# Patient Record
Sex: Male | Born: 1980 | Hispanic: No | Marital: Single | Smoking: Never smoker
Health system: Southern US, Community
[De-identification: ages and names within clinical notes are randomized; demographics above are authoritative.]

---

## 2010-01-02 ENCOUNTER — Encounter
Admission: RE | Admit: 2010-01-02 | Discharge: 2010-01-02 | Payer: Self-pay | Source: Home / Self Care | Attending: Specialist | Admitting: Specialist

## 2010-03-25 ENCOUNTER — Emergency Department (HOSPITAL_COMMUNITY): Payer: Private Health Insurance - Indemnity

## 2010-03-25 ENCOUNTER — Emergency Department (HOSPITAL_COMMUNITY)
Admission: EM | Admit: 2010-03-25 | Discharge: 2010-03-26 | Disposition: A | Discharge status: Home / Self Care | Payer: Private Health Insurance - Indemnity | Attending: Emergency Medicine | Admitting: Emergency Medicine

## 2010-03-25 DIAGNOSIS — S0280XA Fracture of other specified skull and facial bones, unspecified side, initial encounter for closed fracture: Secondary | ICD-10-CM | POA: Insufficient documentation

## 2010-03-25 DIAGNOSIS — Y92009 Unspecified place in unspecified non-institutional (private) residence as the place of occurrence of the external cause: Secondary | ICD-10-CM | POA: Insufficient documentation

## 2010-03-25 DIAGNOSIS — H1130 Conjunctival hemorrhage, unspecified eye: Secondary | ICD-10-CM | POA: Insufficient documentation

## 2010-03-25 DIAGNOSIS — W1809XA Striking against other object with subsequent fall, initial encounter: Secondary | ICD-10-CM | POA: Insufficient documentation

## 2015-01-28 ENCOUNTER — Encounter: Payer: Self-pay | Admitting: Family Medicine

## 2015-01-28 ENCOUNTER — Ambulatory Visit (INDEPENDENT_AMBULATORY_CARE_PROVIDER_SITE_OTHER): Payer: Managed Care, Other (non HMO) | Admitting: Family Medicine

## 2015-01-28 VITALS — BP 122/80 | HR 68 | Wt 205.2 lb

## 2015-01-28 DIAGNOSIS — R196 Halitosis: Secondary | ICD-10-CM | POA: Diagnosis not present

## 2015-01-28 DIAGNOSIS — Z7189 Other specified counseling: Secondary | ICD-10-CM

## 2015-01-28 DIAGNOSIS — Z7689 Persons encountering health services in other specified circumstances: Secondary | ICD-10-CM

## 2015-01-28 NOTE — Patient Instructions (Addendum)
Schedule an appointment to have fasting blood work and physical. See a dentist soon to have your teeth and gums checked and cleaned.   Halitosis Halitosis is bad breath. Halitosis may be caused by:  Foods and drinks that you ingest.  Poor oral hygiene.  Medical conditions, such as sinus infections, mouth infections, and diabetes.  Medicines that dry out your mouth.  Smoking. HOME CARE INSTRUCTIONS  Practice good oral hygiene. Do this by:  Flossing every day. Ask your dentist to show you the best way to floss.  Brushing your teeth at least two times each day using toothpaste that is recommended by your dentist. Ask your dentist to show you the best way to brush your teeth.  Brushing your tongue when you brush your teeth. This may help to improve your breath.  Rinsing your mouth one time each day using mouthwash that is recommended by your dentist.  Scheduling and attending regular dental appointments.  Drink enough water to keep your urine clear or pale yellow.  Eat foods that help to keep your teeth clean, such as carrots and celery.  Avoid foods and drinks that can lead to bad breath, such as:  Garlic.  Onions.  Fish.  Coffee.  Alcohol.  Horseradish.  Red meat.  Do not use any tobacco products, including cigarettes, chewing tobacco, or electronic cigarettes. If you need help quitting, ask your health care provider.  Make sure that any mouth devices that you wear, such as a retainer or dentures, are worn and cleaned properly.  If you have a dry mouth, try chewing gum or mints that do not contain sugar. SEEK MEDICAL CARE IF:  You have new symptoms.  Your symptoms get worse or they do not improve with home care.   This information is not intended to replace advice given to you by your health care provider. Make sure you discuss any questions you have with your health care provider.   Document Released: 02/09/2004 Document Revised: 05/18/2014 Document  Reviewed: 12/28/2013 Elsevier Interactive Patient Education [employer].

## 2015-01-28 NOTE — Progress Notes (Signed)
   Subjective:    Patient ID: Joshua Boyd, male    DOB: 23-Jul-1980, 35 y.o.   MRN: [id number]  HPI Chief Complaint  Patient presents with  . new pt    new pt get est. bad breath 1-2 months, not going away after brushing teeth or flossing   He is new to the practice and here to establish primary care. He also has an acute complaint of bad breath for past 2 months. States he has not changed his diet, no new medications. Denies post nasal drainage, sinus issues, or tooth pain. Reports good oral hygiene, states he brushes his teeth 2 times daily with [employer], states he does brush his tongue. Flosses his teeth daily.  Last dentist appointment was 4 years ago.  Denies fever, chills, sore throat, nasal drainage, cough, nausea, vomiting, abdominal pain, GI or GU complaints. No recent illness.   Previous medical care has been in [place]. He works in [place].  He is from [place].   Denies fever, chills, nausea, headache.   Past medical history: denies  Medications: none Surgeries: none Last physical exam and labs June 2016 in [place]. No medical records. Will need him to have them sent to me.  He states his insurance company told him he needs a physical by April.   Social history: Denies smoking, alcohol use 3 times per week, denies drug use. Lives with wife and 81 year old son.     Review of Systems Pertinent positives and negatives in the history of present illness.    Objective:   Physical Exam  Constitutional: He appears well-developed and well-nourished. No distress.  HENT:  Mouth/Throat: Uvula is midline, oropharynx is clear and moist and mucous membranes are normal. No oral lesions. No dental abscesses. No oropharyngeal exudate or posterior oropharyngeal edema.  Significant tartar buildup, no obvious gum inflammation  Lymphadenopathy:       Head (right side): No submental, no submandibular and no tonsillar adenopathy present.       Head (left side): No  submental, no submandibular and no tonsillar adenopathy present.    He has no cervical adenopathy.       Right: No supraclavicular adenopathy present.       Left: No supraclavicular adenopathy present.   BP 122/80 mmHg  Pulse 68  Wt 205 lb 3.2 oz (93.078 kg)        Assessment & Plan:  Halitosis  Encounter to establish care  Discussed differentials for halitosis including poor oral hygiene and underlying health problems including sinus issues, tonsil stones, liver disease or diabetes. He does not appear to be infectious. No sign of dental abscess, gums do not appear inflamed. He does have significant tartar and recommend seeing a dentist for cleaning and further evaluation. He reports normal labs approximately 7 months ago. He will get medical records sent to me. He will schedule for physical and fasting blood work.

## 2015-03-03 ENCOUNTER — Ambulatory Visit (INDEPENDENT_AMBULATORY_CARE_PROVIDER_SITE_OTHER): Payer: Managed Care, Other (non HMO) | Admitting: Family Medicine

## 2015-03-03 ENCOUNTER — Encounter: Payer: Self-pay | Admitting: Family Medicine

## 2015-03-03 VITALS — BP 122/80 | HR 64 | Ht 69.0 in | Wt 207.2 lb

## 2015-03-03 DIAGNOSIS — Z0000 Encounter for general adult medical examination without abnormal findings: Secondary | ICD-10-CM

## 2015-03-03 DIAGNOSIS — E669 Obesity, unspecified: Secondary | ICD-10-CM

## 2015-03-03 DIAGNOSIS — R196 Halitosis: Secondary | ICD-10-CM

## 2015-03-03 LAB — CBC WITH DIFFERENTIAL/PLATELET
Basophils Absolute: 0 10*3/uL (ref 0.0–0.1)
Basophils Relative: 0 % (ref 0–1)
Eosinophils Absolute: 0.3 10*3/uL (ref 0.0–0.7)
Eosinophils Relative: 6 % — ABNORMAL HIGH (ref 0–5)
HEMATOCRIT: 40.8 % (ref 39.0–52.0)
HEMOGLOBIN: 13.1 g/dL (ref 13.0–17.0)
LYMPHS PCT: 45 % (ref 12–46)
Lymphs Abs: 2.1 10*3/uL (ref 0.7–4.0)
MCH: 25.6 pg — AB (ref 26.0–34.0)
MCHC: 32.1 g/dL (ref 30.0–36.0)
MCV: 79.7 fL (ref 78.0–100.0)
MONO ABS: 0.4 10*3/uL (ref 0.1–1.0)
MONOS PCT: 8 % (ref 3–12)
MPV: 9.9 fL (ref 8.6–12.4)
NEUTROS ABS: 1.9 10*3/uL (ref 1.7–7.7)
Neutrophils Relative %: 41 % — ABNORMAL LOW (ref 43–77)
Platelets: 231 10*3/uL (ref 150–400)
RBC: 5.12 MIL/uL (ref 4.22–5.81)
RDW: 15.6 % — ABNORMAL HIGH (ref 11.5–15.5)
WBC: 4.7 10*3/uL (ref 4.0–10.5)

## 2015-03-03 LAB — POCT URINALYSIS DIPSTICK
Bilirubin, UA: NEGATIVE
Blood, UA: NEGATIVE
GLUCOSE UA: NEGATIVE
KETONES UA: NEGATIVE
Leukocytes, UA: NEGATIVE
Nitrite, UA: NEGATIVE
PROTEIN UA: NEGATIVE
SPEC GRAV UA: 1.02
Urobilinogen, UA: NEGATIVE
pH, UA: 7.5

## 2015-03-03 NOTE — Patient Instructions (Signed)
Preventative Care for Adults, Male       REGULAR HEALTH EXAMS:  A routine yearly physical is a good way to check in with your primary care provider about your health and preventive screening. It is also an opportunity to share updates about your health and any concerns you have, and receive a thorough all-over exam.   Most health insurance companies pay for at least some preventative services.  Check with your health plan for specific coverages.  WHAT PREVENTATIVE SERVICES DO MEN NEED?  Adult men should have their weight and blood pressure checked regularly.   Men age 35 and older should have their cholesterol levels checked regularly.  Beginning at age 50 and continuing to age 75, men should be screened for colorectal cancer.  Certain people should may need continued testing until age 85.  Other cancer screening may include exams for testicular and prostate cancer.  Updating vaccinations is part of preventative care.  Vaccinations help protect against diseases such as the flu.  Lab tests are generally done as part of preventative care to screen for anemia and blood disorders, to screen for problems with the kidneys and liver, to screen for bladder problems, to check blood sugar, and to check your cholesterol level.  Preventative services generally include counseling about diet, exercise, avoiding tobacco, drugs, excessive alcohol consumption, and sexually transmitted infections.    GENERAL RECOMMENDATIONS FOR GOOD HEALTH:  Healthy diet:  Eat a variety of foods, including fruit, vegetables, animal or vegetable protein, such as meat, fish, chicken, and eggs, or beans, lentils, tofu, and grains, such as rice.  Drink plenty of water daily.  Decrease saturated fat in the diet, avoid lots of red meat, processed foods, sweets, fast foods, and fried foods.  Exercise:  Aerobic exercise helps maintain good heart health. At least 30-40 minutes of moderate-intensity exercise is recommended.  For example, a brisk walk that increases your heart rate and breathing. This should be done on most days of the week.   Find a type of exercise or a variety of exercises that you enjoy so that it becomes a part of your daily life.  Examples are running, walking, swimming, water aerobics, and biking.  For motivation and support, explore group exercise such as aerobic class, spin class, Zumba, Yoga,or  martial arts, etc.    Set exercise goals for yourself, such as a certain weight goal, walk or run in a race such as a 5k walk/run.  Speak to your primary care provider about exercise goals.  Disease prevention:  If you smoke or chew tobacco, find out from your caregiver how to quit. It can literally save your life, no matter how long you have been a tobacco user. If you do not use tobacco, never begin.   Maintain a healthy diet and normal weight. Increased weight leads to problems with blood pressure and diabetes.   The Body Mass Index or BMI is a way of measuring how much of your body is fat. Having a BMI above 27 increases the risk of heart disease, diabetes, hypertension, stroke and other problems related to obesity. Your caregiver can help determine your BMI and based on it develop an exercise and dietary program to help you achieve or maintain this important measurement at a healthful level.  High blood pressure causes heart and blood vessel problems.  Persistent high blood pressure should be treated with medicine if weight loss and exercise do not work.   Fat and cholesterol leaves deposits in your arteries   that can block them. This causes heart disease and vessel disease elsewhere in your body.  If your cholesterol is found to be high, or if you have heart disease or certain other medical conditions, then you may need to have your cholesterol monitored frequently and be treated with medication.   Ask if you should have a stress test if your history suggests this. A stress test is a test done on  a treadmill that looks for heart disease. This test can find disease prior to there being a problem.  Avoid drinking alcohol in excess (more than two drinks per day).  Avoid use of street drugs. Do not share needles with anyone. Ask for professional help if you need assistance or instructions on stopping the use of alcohol, cigarettes, and/or drugs.  Brush your teeth twice a day with fluoride toothpaste, and floss once a day. Good oral hygiene prevents tooth decay and gum disease. The problems can be painful, unattractive, and can cause other health problems. Visit your dentist for a routine oral and dental check up and preventive care every 6-12 months.   Look at your skin regularly.  Use a mirror to look at your back. Notify your caregivers of changes in moles, especially if there are changes in shapes, colors, a size larger than a pencil eraser, an irregular border, or development of new moles.  Safety:  Use seatbelts 100% of the time, whether driving or as a passenger.  Use safety devices such as hearing protection if you work in environments with loud noise or significant background noise.  Use safety glasses when doing any work that could send debris in to the eyes.  Use a helmet if you ride a bike or motorcycle.  Use appropriate safety gear for contact sports.  Talk to your caregiver about gun safety.  Use sunscreen with a SPF (or skin protection factor) of 15 or greater.  Lighter skinned people are at a greater risk of skin cancer. Don't forget to also wear sunglasses in order to protect your eyes from too much damaging sunlight. Damaging sunlight can accelerate cataract formation.   Practice safe sex. Use condoms. Condoms are used for birth control and to help reduce the spread of sexually transmitted infections (or STIs).  Some of the STIs are gonorrhea (the clap), chlamydia, syphilis, trichomonas, herpes, HPV (human papilloma virus) and HIV (human immunodeficiency virus) which causes AIDS.  The herpes, HIV and HPV are viral illnesses that have no cure. These can result in disability, cancer and death.   Keep carbon monoxide and smoke detectors in your home functioning at all times. Change the batteries every 6 months or use a model that plugs into the wall.   Vaccinations:  Stay up to date with your tetanus shots and other required immunizations. You should have a booster for tetanus every 10 years. Be sure to get your flu shot every year, since 5%-20% of the [place] population comes down with the flu. The flu vaccine changes each year, so being vaccinated once is not enough. Get your shot in the fall, before the flu season peaks.   Other vaccines to consider:  Pneumococcal vaccine to protect against certain types of pneumonia.  This is normally recommended for adults age 65 or older.  However, adults younger than 33 years old with certain underlying conditions such as diabetes, heart or lung disease should also receive the vaccine.  Shingles vaccine to protect against Varicella Zoster if you are older than age 60, or younger   than 33 years old with certain underlying illness.  Hepatitis A vaccine to protect against a form of infection of the liver by a virus acquired from food.  Hepatitis B vaccine to protect against a form of infection of the liver by a virus acquired from blood or body fluids, particularly if you work in health care.  If you plan to travel internationally, check with your local health department for specific vaccination recommendations.  Cancer Screening:  Most routine colon cancer screening begins at the age of 50. On a yearly basis, doctors may provide special easy to use take-home tests to check for hidden blood in the stool. Sigmoidoscopy or colonoscopy can detect the earliest forms of colon cancer and is life saving. These tests use a small camera at the end of a tube to directly examine the colon. Speak to your caregiver about this at age 50, when routine  screening begins (and is repeated every 5 years unless early forms of pre-cancerous polyps or small growths are found).   At the age of 50 men usually start screening for prostate cancer every year. Screening may begin at a younger age for those with higher risk. Those at higher risk include African-Americans or having a family history of prostate cancer. There are two types of tests for prostate cancer:   Prostate-specific antigen (PSA) testing. Recent studies raise questions about prostate cancer using PSA and you should discuss this with your caregiver.   Digital rectal exam (in which your doctor's lubricated and gloved finger feels for enlargement of the prostate through the anus).   Screening for testicular cancer.  Do a monthly exam of your testicles. Gently roll each testicle between your thumb and fingers, feeling for any abnormal lumps. The best time to do this is after a hot shower or bath when the tissues are looser. Notify your caregivers of any lumps, tenderness or changes in size or shape immediately.     

## 2015-03-03 NOTE — Progress Notes (Signed)
Subjective:    Patient ID: Joshua Boyd, male    DOB: 01-Dec-1980, 35 y.o.   MRN: [id number]  HPI Chief Complaint  Patient presents with  . fasting cpe    fasting cpe. no other concerns   He is here for complete physical exam. He states his last exam and blood work was June 2016 but his insurance company told him he needed to have a physical by April 2017. Moved here from [place] 5 years ago.  I do not have medical records from his previous PCP, he will sign release today.  States he has not seen dentist yet. This was recommended at his last appointment for halitosis.  States his bad breath has improved since his last visit and that he has been brushing and flossing more.   Other providers: none  Diet: yams, maize, soups with fish, beef. Pasta. Fruits and vegetables. "african diet" Exercise: not really.  Immunizations: no flu shot- refuses, Tdap- 5 years ( he reports having this when he moved from [place])  Health maintenance-  Dentist: years ago. Overdue.  Last eye exam: never  Colonoscopy: never  Family history significant for HTN  Safety:  Wears seatbelt: always Smoke detectors: always No guns in home.   Lives with wife and son who is 2 Works: Surveyor, quantity for [employer]    Review of Systems Review of Systems Constitutional: -fever, -chills, -sweats, -unexpected weight change,-fatigue ENT: -runny nose, -ear pain, -sore throat Cardiology:  -chest pain, -palpitations, -edema Respiratory: -cough, -shortness of breath, -wheezing Gastroenterology: -abdominal pain, -nausea, -vomiting, -diarrhea, -constipation  Hematology: -bleeding or bruising problems Musculoskeletal: -arthralgias, -myalgias, -joint swelling, -back pain Ophthalmology: -vision changes Urology: -dysuria, -difficulty urinating, -hematuria, -urinary frequency, -urgency Neurology: -headache, -weakness, -tingling, -numbness       Objective:   Physical Exam BP 122/80 mmHg  Pulse 64  Ht 5\' 9"   (1.753 m)  Wt 207 lb 3.2 oz (93.985 kg)  BMI 30.58 kg/m2  General Appearance:    Alert, cooperative, no distress, appears stated age  Head:    Normocephalic, without obvious abnormality, atraumatic  Eyes:    PERRL, conjunctiva/corneas clear, EOM's intact, fundi    benign  Ears:    Normal TM's and external ear canals  Nose:   Nares normal, mucosa normal, no drainage or sinus   tenderness  Throat:   Lips, mucosa, and tongue normal; teeth and gums normal  Neck:   Supple, no lymphadenopathy;  thyroid:  no   enlargement/tenderness/nodules; no carotid   bruit or JVD  Back:    Spine nontender, no curvature, ROM normal, no CVA     tenderness  Lungs:     Clear to auscultation bilaterally without wheezes, rales or     ronchi; respirations unlabored  Chest Wall:    No tenderness or deformity   Heart:    Regular rate and rhythm, S1 and S2 normal, no murmur, rub   or gallop  Breast Exam:    No chest wall tenderness, masses or gynecomastia  Abdomen:     Soft, non-tender, nondistended, normoactive bowel sounds,    no masses, no hepatosplenomegaly  Genitalia:    Refused  Rectal:   Deferred due to age <40 and lack of symptoms  Extremities:   No clubbing, cyanosis or edema  Pulses:   2+ and symmetric all extremities  Skin:   Skin color, texture, turgor normal, no rashes or lesions  Lymph nodes:   Cervical, supraclavicular, and axillary nodes normal  Neurologic:   CNII-XII  intact, normal strength, sensation and gait; reflexes 2+ and symmetric throughout          Psych:   Normal mood, affect, hygiene and grooming.     Urinalysis dipstick: negative     Assessment & Plan:  Routine general medical examination at a health care facility - Plan: POCT urinalysis dipstick, CBC with Differential/Platelet, Comprehensive metabolic panel, Lipid panel  Obesity  Halitosis  Discussed that his BMI places him in the obese category and I recommend that he be aware of his calorie intake and start getting a minimum  of 150 minutes of physical activity per week.  Discussed safety and health maintenance.  Recommend that he schedule a dental appointment for cleaning and overall oral health.  Refused flu shot.

## 2015-03-04 LAB — LIPID PANEL
CHOLESTEROL: 198 mg/dL (ref 125–200)
HDL: 84 mg/dL (ref >=40)
LDL CALC: 105 mg/dL (ref <130)
TRIGLYCERIDES: 43 mg/dL (ref <150)
Total CHOL/HDL Ratio: 2.4 Ratio (ref <=5.0)
VLDL: 9 mg/dL (ref <30)

## 2015-03-04 LAB — COMPREHENSIVE METABOLIC PANEL
ALBUMIN: 4.4 g/dL (ref 3.6–5.1)
ALK PHOS: 92 U/L (ref 40–115)
ALT: 30 U/L (ref 9–46)
AST: 27 U/L (ref 10–40)
BILIRUBIN TOTAL: 0.4 mg/dL (ref 0.2–1.2)
BUN: 13 mg/dL (ref 7–25)
CO2: 28 mmol/L (ref 20–31)
CREATININE: 1.1 mg/dL (ref 0.60–1.35)
Calcium: 10.4 mg/dL — ABNORMAL HIGH (ref 8.6–10.3)
Chloride: 104 mmol/L (ref 98–110)
Glucose, Bld: 92 mg/dL (ref 65–99)
Potassium: 4.6 mmol/L (ref 3.5–5.3)
SODIUM: 140 mmol/L (ref 135–146)
TOTAL PROTEIN: 7.5 g/dL (ref 6.1–8.1)

## 2018-12-08 ENCOUNTER — Other Ambulatory Visit: Payer: Self-pay | Admitting: Internal Medicine

## 2018-12-08 DIAGNOSIS — E215 Disorder of parathyroid gland, unspecified: Secondary | ICD-10-CM

## 2018-12-19 ENCOUNTER — Ambulatory Visit
Admission: RE | Admit: 2018-12-19 | Discharge: 2018-12-19 | Disposition: A | Discharge status: Home / Self Care | Payer: 59 | Source: Ambulatory Visit | Attending: Internal Medicine | Admitting: Internal Medicine

## 2018-12-19 DIAGNOSIS — E215 Disorder of parathyroid gland, unspecified: Secondary | ICD-10-CM

## 2019-01-02 ENCOUNTER — Other Ambulatory Visit: Payer: Self-pay | Admitting: Internal Medicine

## 2019-01-02 ENCOUNTER — Other Ambulatory Visit: Payer: Self-pay

## 2019-01-02 DIAGNOSIS — D351 Benign neoplasm of parathyroid gland: Secondary | ICD-10-CM

## 2019-01-28 ENCOUNTER — Other Ambulatory Visit: Payer: 59

## 2019-03-18 ENCOUNTER — Ambulatory Visit
Admission: RE | Admit: 2019-03-18 | Discharge: 2019-03-18 | Disposition: A | Discharge status: Home / Self Care | Payer: 59 | Source: Ambulatory Visit | Attending: Internal Medicine | Admitting: Internal Medicine

## 2019-03-18 DIAGNOSIS — D351 Benign neoplasm of parathyroid gland: Secondary | ICD-10-CM

## 2019-03-18 MED ORDER — IOPAMIDOL (ISOVUE-300) INJECTION 61%
75.0000 mL | Freq: Once | INTRAVENOUS | Status: AC | PRN
  Administered 2019-03-18: 75 mL via INTRAVENOUS

## 2021-12-19 IMAGING — CT CT NECK SOFT TISSUE WO/W CM
3 of 9 series · 10 of 33 positions shown, 11 images · IV contrast (iopamidol)
Comparison: Thyroid ultrasound 12/19/2018

CLINICAL DATA: Parathyroid adenoma.

EXAM:
CT NECK WITH AND WITHOUT CONTRAST
TECHNIQUE: Multidetector CT imaging of the neck was performed without and with
intravenous contrast.
CONTRAST:  75mL 1SRMVU-0AA IOPAMIDOL (1SRMVU-0AA) INJECTION 61%

[Series 6: (id) mm · axial · 0.42mm/px · z∈[-201,-141]mm · 2 of 182 slices shown, 3 images]
[im 61/182  soft-tissue]
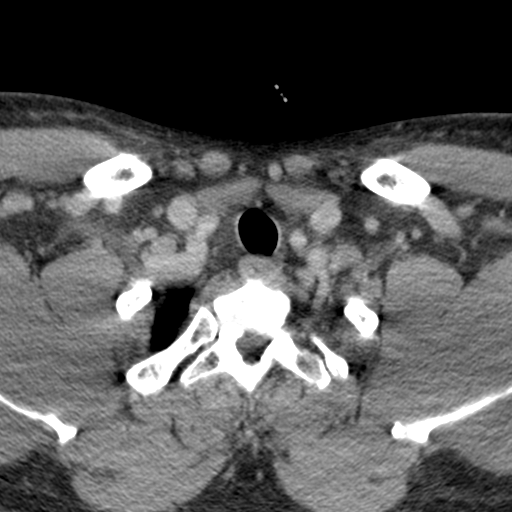
[im 61/182  bone]
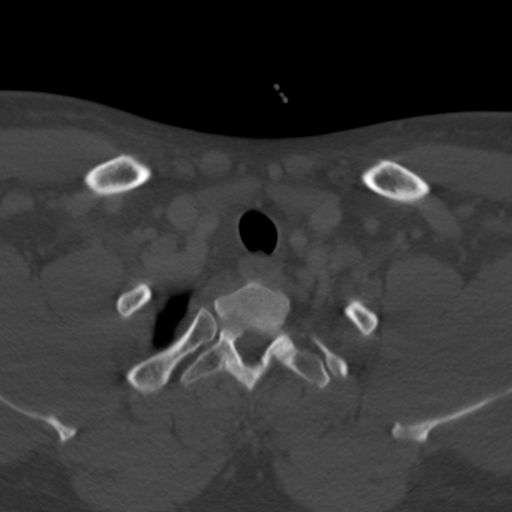
[im 121/182  bone]
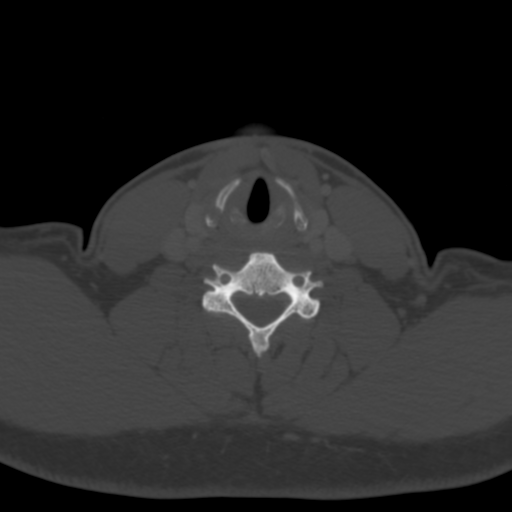

[Series 9: (id) · sagittal · 0.36mm/px · 5 of 113 slices shown]
[im 19/113  bone]
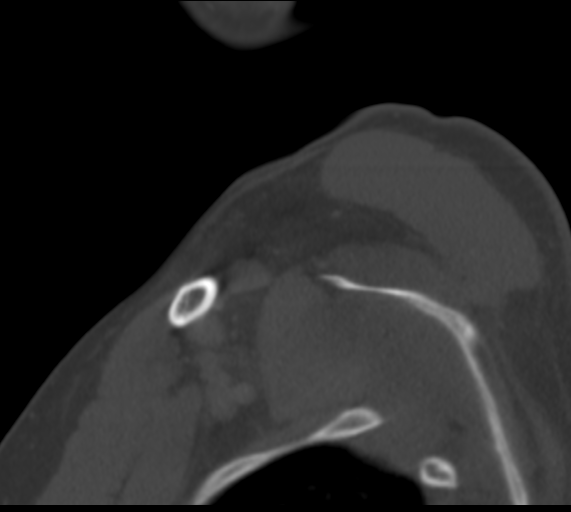
[im 38/113  bone]
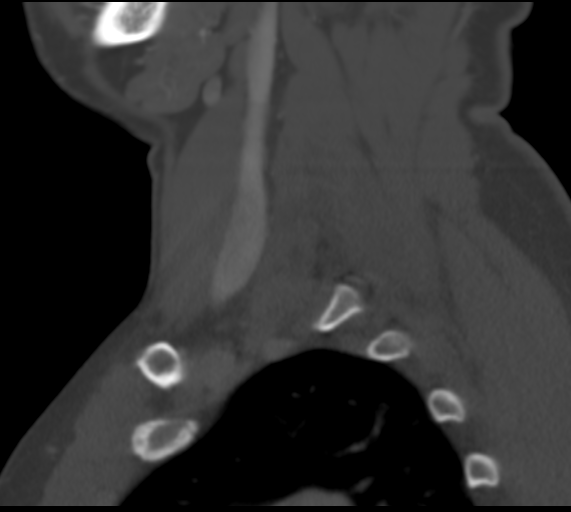
[im 57/113  bone]
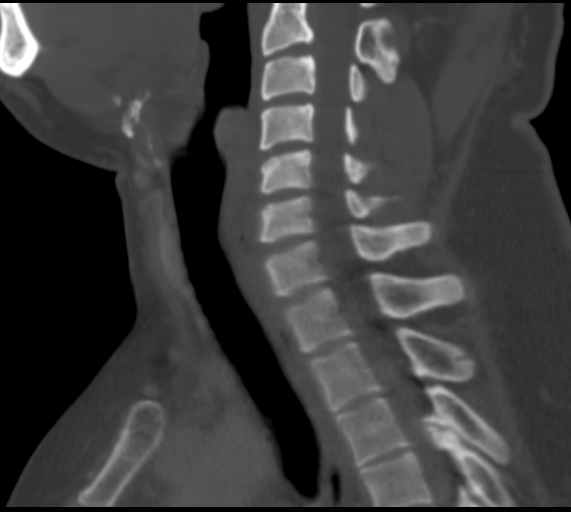
[im 75/113  bone]
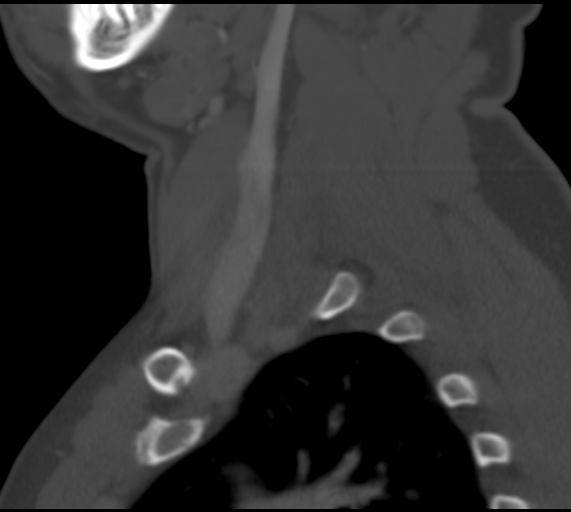
[im 94/113  bone]
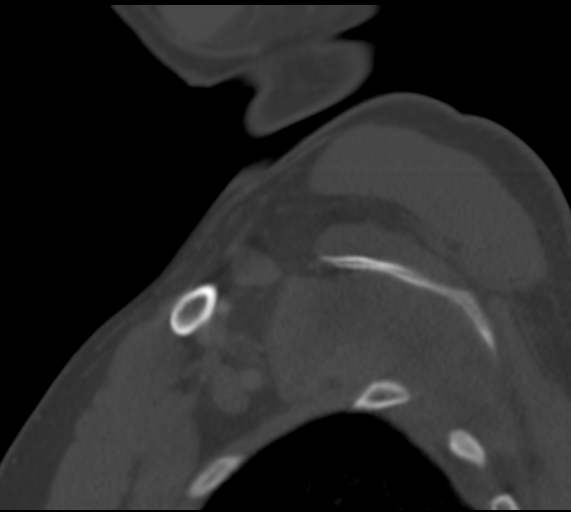

[Series 11: neck-venous-cor 2mm · coronal · portal-venous · 0.35mm/px · 3 of 111 slices shown]
[im 30/111  bone]
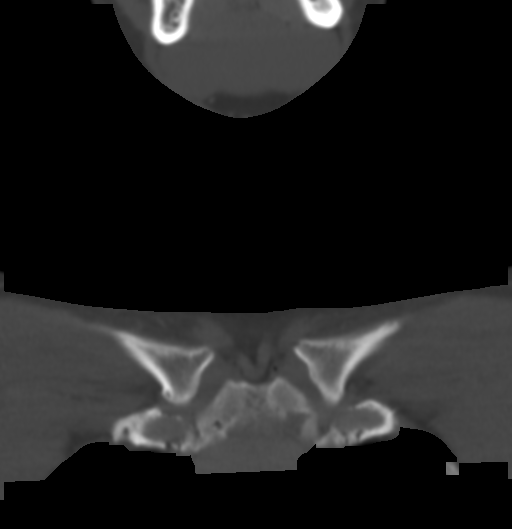
[im 50/111  bone]
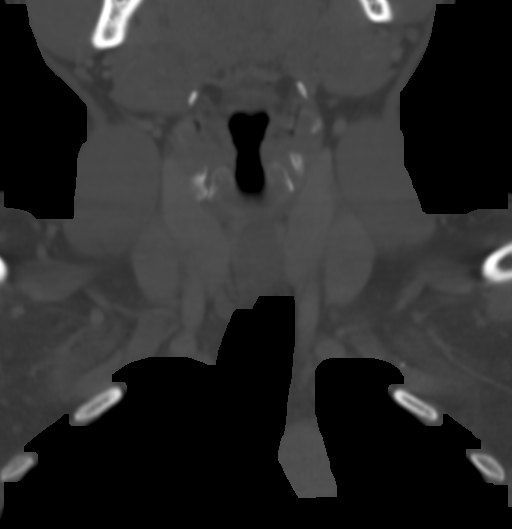
[im 70/111  bone]
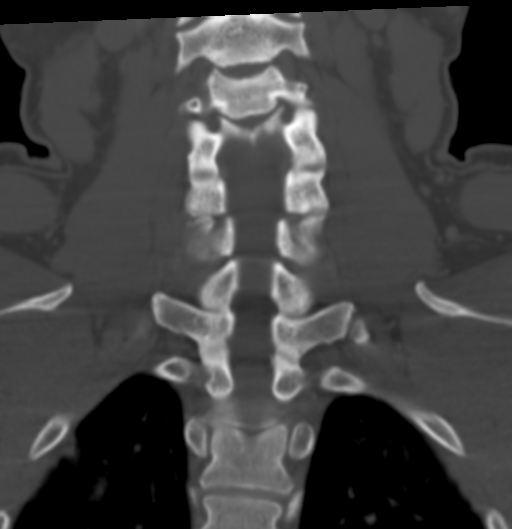

[10 of 33 positions shown; findings below may reference images not displayed]

FINDINGS: Pharynx and larynx: No evidence of mass or swelling.

Salivary glands: Unremarkable appearance of the submandibular glands
and included portions of the parotid glands.

Thyroid: The thyroid is unremarkable. A 1.3 cm soft tissue nodule
posterior to the lower pole of the right thyroid lobe likely
corresponds to the nodule seen on ultrasound. This is
hypoattenuating relative to the thyroid gland on noncontrast,
arterial, and venous phases and demonstrates only mild enhancement.
No mass is identified elsewhere in the neck or superior mediastinum.

Lymph nodes: No enlarged or suspicious lymph nodes in the neck.

Vascular: Unremarkable.

Skeleton: No suspicious osseous lesion.

Upper chest: Clear lung apices.

Other: None.
IMPRESSION: 1. 1.3 cm nodule posterior to the lower pole of the right thyroid
lobe. While a parathyroid adenoma is a consideration, this does not
demonstrate the classic enhancement pattern, and alternative
etiologies such as a lymph node are also possible.
2. No other candidate lesions identified elsewhere in the neck or
superior mediastinum.

## 2022-05-23 ENCOUNTER — Encounter: Payer: Self-pay | Admitting: Dietician

## 2022-05-23 ENCOUNTER — Encounter: Payer: PRIVATE HEALTH INSURANCE | Attending: Internal Medicine | Admitting: Dietician

## 2022-05-23 VITALS — Ht 69.0 in | Wt 233.0 lb

## 2022-05-23 DIAGNOSIS — E119 Type 2 diabetes mellitus without complications: Secondary | ICD-10-CM

## 2022-05-23 NOTE — Patient Instructions (Addendum)
Goal: continue walking 2 miles a day and soccer on weekend.   Goal: aim to make 1/2 of your plate vegetables at least 1 time per day.   Goal: Drink at least 4 water bottles daily (64 oz).   Aim to eat within 1-2 hours of waking up and every 3-5 hours following. At snacks aim to include a complex carbohydrate and protein.  Aim to focus on primarily water intake. Reduce juice.   At meals aim to include 1/4 protein, 1/4 complex carbs, 1/2 plate non-starchy vegetables.

## 2022-05-23 NOTE — Progress Notes (Signed)
Diabetes Self-Management Education  Visit Type: First/Initial  Appt. Start Time: 1545 Appt. End Time: 1635  05/23/2022  [name], identified by name and date of birth, is a 42 y.o. male with a diagnosis of Diabetes: Type 2.   ASSESSMENT  History includes: type 2 diabetes Labs noted: A1c Medications include: metformin Supplements: none reported  Did not have pt's labs at time of visit. Pt is unsure of his exact A1c but states he thinks it is in the 8's.   Pt reports he has scheduled his yearly eye exam but not his dental exam yet.   Pt states he usually skips breakfast because he feels that it will make him sluggish at work.   Pt states the last 3-4 months have been his busy season at work and he feels that he was drinking mostly coffee, not much water, eating out more, and not exercising. Pt states he is now in a [employer] group that walks 2 miles every day to total 10 miles per week which is what he has been doing.   Pt usually packs leftovers for work. Pt's wife cooks dinner at home most nights and pt reports they do not eat out often.   Pt states his meals mostly consist of rice and beans and yam/corn. He states he does not include a lot of vegetables unless they are in a stew. He wants to work on including more vegetables in his meal.  Height 5\' 9"  (1.753 m), weight 233 lb (105.7 kg). Body mass index is 34.41 kg/m.   Diabetes Self-Management Education - 05/23/22 1554       Visit Information   Visit Type First/Initial      Initial Visit   Diabetes Type Type 2    Date Diagnosed april 2024    Are you currently following a meal plan? No    Are you taking your medications as prescribed? Yes      Health Coping   How would you rate your overall health? Fair      Psychosocial Assessment   Patient Belief/Attitude about Diabetes Afraid    What is the hardest part about your diabetes right now, causing you the most concern, or is the most worrisome to you about  your diabetes?   Making healty food and beverage choices    Self-care barriers None    Self-management support Doctor's office    Other persons present Patient    Patient Concerns Nutrition/Meal planning    Special Needs None    Preferred Learning Style No preference indicated    Learning Readiness Ready    How often do you need to have someone help you when you read instructions, pamphlets, or other written materials from your doctor or pharmacy? 1 - Never    What is the last grade level you completed in school? MBA      Pre-Education Assessment   Patient understands the diabetes disease and treatment process. Needs Instruction    Patient understands incorporating nutritional management into lifestyle. Needs Instruction    Patient undertands incorporating physical activity into lifestyle. Needs Instruction    Patient understands using medications safely. Needs Instruction    Patient understands monitoring blood glucose, interpreting and using results Needs Instruction    Patient understands prevention, detection, and treatment of acute complications. Needs Instruction    Patient understands prevention, detection, and treatment of chronic complications. Needs Instruction    Patient understands how to develop strategies to address psychosocial issues. Needs Instruction  Patient understands how to develop strategies to promote health/change behavior. Needs Instruction      Complications   How often do you check your blood sugar? 0 times/day (not testing)    Have you had a dilated eye exam in the past 12 months? No    Have you had a dental exam in the past 12 months? No    Are you checking your feet? No      Dietary Intake   Breakfast none    Snack (morning) none    Lunch 12pm: rice and beans and yam sometimes meat    Snack (afternoon) none OR peanuts    Dinner 8pm: rice and beans and yam/corn    Snack (evening) apple OR mango    Beverage(s) 32 oz coffee, 48 oz water, sometimes  juice 1-2 c, 1-2 alcoholic drinks      Activity / Exercise   Activity / Exercise Type Light (walking / raking leaves)    How many days per week do you exercise? 5    How many minutes per day do you exercise? 30    Total minutes per week of exercise 150      Patient Education   Previous Diabetes Education No    Disease Pathophysiology Definition of diabetes, type 1 and 2, and the diagnosis of diabetes;Explored patient's options for treatment of their diabetes;Factors that contribute to the development of diabetes    Healthy Eating Role of diet in the treatment of diabetes and the relationship between the three main macronutrients and blood glucose level;Plate Method;Reviewed blood glucose goals for pre and post meals and how to evaluate the patients' food intake on their blood glucose level.;Meal timing in regards to the patients' current diabetes medication.;Effects of alcohol on blood glucose and safety factors with consumption of alcohol.;Meal options for control of blood glucose level and chronic complications.    Being Active Role of exercise on diabetes management, blood pressure control and cardiac health.;Helped patient identify appropriate exercises in relation to his/her diabetes, diabetes complications and other health issue.    Medications Reviewed patients medication for diabetes, action, purpose, timing of dose and side effects.    Monitoring Identified appropriate SMBG and/or A1C goals.;Daily foot exams;Yearly dilated eye exam    Acute complications Taught prevention, symptoms, and  treatment of hypoglycemia - the 15 rule.;Discussed and identified patients' prevention, symptoms, and treatment of hyperglycemia.    Chronic complications Relationship between chronic complications and blood glucose control;Assessed and discussed foot care and prevention of foot problems;Identified and discussed with patient  current chronic complications;Dental care;Retinopathy and reason for yearly dilated  eye exams;Nephropathy, what it is, prevention of, the use of ACE, ARB's and early detection of through urine microalbumia.    Diabetes Stress and Support Identified and addressed patients feelings and concerns about diabetes;Role of stress on diabetes    Lifestyle and Health Coping Lifestyle issues that need to be addressed for better diabetes care      Individualized Goals (developed by patient)   Nutrition General guidelines for healthy choices and portions discussed    Physical Activity 30 minutes per day;Exercise 5-7 days per week    Medications take my medication as prescribed    Monitoring  Test my blood glucose as discussed    Problem Solving Eating Pattern    Reducing Risk examine blood glucose patterns;do foot checks daily;treat hypoglycemia with 15 grams of carbs if blood glucose less than 70mg /dL    Health Coping Ask for help with psychological, social,  or emotional issues      Post-Education Assessment   Patient understands the diabetes disease and treatment process. Comprehends key points    Patient understands incorporating nutritional management into lifestyle. Comprehends key points    Patient undertands incorporating physical activity into lifestyle. Comprehends key points    Patient understands using medications safely. Comphrehends key points    Patient understands monitoring blood glucose, interpreting and using results Comprehends key points    Patient understands prevention, detection, and treatment of acute complications. Comprehends key points    Patient understands prevention, detection, and treatment of chronic complications. Comprehends key points    Patient understands how to develop strategies to address psychosocial issues. Comprehends key points    Patient understands how to develop strategies to promote health/change behavior. Comprehends key points      Outcomes   Expected Outcomes Demonstrated interest in learning. Expect positive outcomes    Future DMSE  3-4 months    Program Status Not Completed             Individualized Plan for Diabetes Self-Management Training:   Learning Objective:  Patient will have a greater understanding of diabetes self-management. Patient education plan is to attend individual and/or group sessions per assessed needs and concerns.   Plan:   Patient Instructions  Goal: continue walking 2 miles a day and soccer on weekend.   Goal: aim to make 1/2 of your plate vegetables at least 1 time per day.   Goal: Drink at least 4 water bottles daily (64 oz).   Aim to eat within 1-2 hours of waking up and every 3-5 hours following. At snacks aim to include a complex carbohydrate and protein.  Aim to focus on primarily water intake. Reduce juice.   At meals aim to include 1/4 protein, 1/4 complex carbs, 1/2 plate non-starchy vegetables.   Expected Outcomes:  Demonstrated interest in learning. Expect positive outcomes  Education material provided: ADA - How to Thrive: A Guide for Your Journey with Diabetes, Non-starchy vegetable list  If problems or questions, patient to contact team via:  Phone  Future DSME appointment: 3-4 months

## 2022-06-22 ENCOUNTER — Other Ambulatory Visit: Payer: Self-pay | Admitting: Internal Medicine

## 2022-06-22 ENCOUNTER — Ambulatory Visit
Admission: RE | Admit: 2022-06-22 | Discharge: 2022-06-22 | Disposition: A | Discharge status: Home / Self Care | Payer: PRIVATE HEALTH INSURANCE | Source: Ambulatory Visit | Attending: Internal Medicine | Admitting: Internal Medicine

## 2022-06-22 DIAGNOSIS — M25471 Effusion, right ankle: Secondary | ICD-10-CM

## 2022-09-25 ENCOUNTER — Ambulatory Visit: Payer: PRIVATE HEALTH INSURANCE | Admitting: Dietician

## 2022-10-02 ENCOUNTER — Ambulatory Visit: Payer: PRIVATE HEALTH INSURANCE | Admitting: Dietician

## 2023-05-28 ENCOUNTER — Ambulatory Visit (HOSPITAL_BASED_OUTPATIENT_CLINIC_OR_DEPARTMENT_OTHER): Admitting: Family Medicine

## 2023-09-02 ENCOUNTER — Ambulatory Visit (HOSPITAL_BASED_OUTPATIENT_CLINIC_OR_DEPARTMENT_OTHER): Admitting: Family Medicine

## 2023-09-02 ENCOUNTER — Encounter (HOSPITAL_BASED_OUTPATIENT_CLINIC_OR_DEPARTMENT_OTHER): Payer: Self-pay

## 2023-12-04 ENCOUNTER — Telehealth: Payer: Self-pay

## 2023-12-04 ENCOUNTER — Ambulatory Visit: Payer: Self-pay | Admitting: Family Medicine

## 2023-12-04 ENCOUNTER — Encounter: Payer: Self-pay | Admitting: Family Medicine

## 2023-12-04 ENCOUNTER — Other Ambulatory Visit: Payer: Self-pay

## 2023-12-04 ENCOUNTER — Other Ambulatory Visit (HOSPITAL_COMMUNITY): Payer: Self-pay

## 2023-12-04 VITALS — BP 122/84 | HR 79 | Ht 69.0 in | Wt 237.2 lb

## 2023-12-04 DIAGNOSIS — Z23 Encounter for immunization: Secondary | ICD-10-CM | POA: Diagnosis not present

## 2023-12-04 DIAGNOSIS — Z136 Encounter for screening for cardiovascular disorders: Secondary | ICD-10-CM | POA: Diagnosis not present

## 2023-12-04 DIAGNOSIS — E669 Obesity, unspecified: Secondary | ICD-10-CM | POA: Diagnosis not present

## 2023-12-04 DIAGNOSIS — Z9889 Other specified postprocedural states: Secondary | ICD-10-CM

## 2023-12-04 DIAGNOSIS — R0683 Snoring: Secondary | ICD-10-CM

## 2023-12-04 DIAGNOSIS — E119 Type 2 diabetes mellitus without complications: Secondary | ICD-10-CM

## 2023-12-04 DIAGNOSIS — Z0000 Encounter for general adult medical examination without abnormal findings: Secondary | ICD-10-CM | POA: Diagnosis not present

## 2023-12-04 DIAGNOSIS — Z7984 Long term (current) use of oral hypoglycemic drugs: Secondary | ICD-10-CM

## 2023-12-04 DIAGNOSIS — Z9089 Acquired absence of other organs: Secondary | ICD-10-CM

## 2023-12-04 LAB — POCT GLYCOSYLATED HEMOGLOBIN (HGB A1C): Hemoglobin A1C: 6.5 % — AB (ref 4.0–5.6)

## 2023-12-04 MED ORDER — TIRZEPATIDE 2.5 MG/0.5ML ~~LOC~~ SOAJ: 2.5000 mg | SUBCUTANEOUS | 0 refills | Status: AC

## 2023-12-04 MED ORDER — METFORMIN HCL 1000 MG PO TABS: 1000.0000 mg | ORAL_TABLET | Freq: Two times a day (BID) | ORAL | 1 refills | Status: AC

## 2023-12-04 NOTE — Telephone Encounter (Signed)
 Pharmacy Patient Advocate Encounter   Received notification from [employer] that prior authorization for tirzepatide (MOUNJARO) 2.5 MG/0.5ML Pen  is required/requested.   Insurance verification completed.   The patient is insured through [employer].   Per test claim: PA required; PA submitted to above mentioned insurance via Latent Key/confirmation #/EOC [id number] Status is pending

## 2023-12-04 NOTE — Progress Notes (Signed)
 Name: Joshua Boyd   Date of Visit: 12/04/23   Date of last visit with me: Visit date not found   CHIEF COMPLAINT:  Chief Complaint  Patient presents with   Establish Care    New patient, physical exam. No questions or concerns.        HPI:  Discussed the use of AI scribe software for clinical note transcription with the patient, who gave verbal consent to proceed.  History of Present Illness   Joshua Boyd is a 43 year old male who presents for evaluation of blood sugar levels and weight management and annual physical.   Two years ago, he was diagnosed with prediabetes and was prescribed metformin, which he discontinued over a year ago. He reports that he has gained weight since his last evaluation.  He acknowledges difficulty in losing weight despite attempts to do so.  He reports snoring. He has not received a tetanus booster in over ten years. No other concerns at this time.         OBJECTIVE:       12/04/2023    9:13 AM  Depression screen PHQ 2/9  Decreased Interest 0  Down, Depressed, Hopeless 0  PHQ - 2 Score 0     BP Readings from Last 3 Encounters:  12/04/23 122/84  03/03/15 122/80  01/28/15 122/80    BP 122/84   Pulse 79   Ht 5' 9 (1.753 m)   Wt 237 lb 3.2 oz (107.6 kg)   SpO2 97%   BMI 35.03 kg/m    Physical Exam          Physical Exam Constitutional:      Appearance: Normal appearance. He is obese.  Neurological:     General: No focal deficit present.     Mental Status: He is alert and oriented to person, place, and time. Mental status is at baseline.     ASSESSMENT/PLAN:   Assessment & Plan H/O parathyroidectomy  Annual physical exam  Encounter for screening for cardiovascular disorders  Obesity (BMI 35.0-39.9 without comorbidity)  Snoring  Need for tetanus booster  Diabetes mellitus treated with oral medication (HCC)    Assessment and Plan    Adult Wellness Visit Routine wellness visit. Tetanus  booster overdue. - Administered tetanus booster. - Ordered comprehensive lab work including cholesterol and A1c. -Comprehensive annual physical exam completed today. Reviewed interval history, current medical issues, medications, allergies, and preventive care needs. Addressed all patient questions and concerns. Discussed lifestyle factors including diet, exercise, sleep, and stress management. Reviewed recommended age-appropriate screenings, labs, and vaccinations. Counseling provided on healthy habits and routine health maintenance. Follow-up as indicated based on findings and results.   Type 2 diabetes mellitus A1c at 6.5% indicates diabetes. Weight gain likely contributing. No genetic predisposition from [place]. Weight loss medications considered. - Prescribed metformin to prevent worsening diabetes. - Prescribed Mounjaro injection for weight loss. - Coordinated with insurance for medication coverage. - Instructed to report after third injection of first dose and after third injection of second dose for follow-up. - CBC, CMP and BMP - Consider starting lisinopril.   Diabetes Mellitis new onset with A1C of 6.5 Weight is significantly contributing to elevated A1c. Weight loss is primary goal to manage diabetes and improve health. - Initiated Mounjaro injection for weight loss. - Monitor weight and A1c levels.  Snoring Snoring suggests possible sleep apnea. - Ordered sleep study.         Brennah Quraishi A. Vita MD [facility]

## 2023-12-05 ENCOUNTER — Ambulatory Visit: Payer: Self-pay | Admitting: Family Medicine

## 2023-12-05 DIAGNOSIS — E1169 Type 2 diabetes mellitus with other specified complication: Secondary | ICD-10-CM

## 2023-12-05 LAB — COMPREHENSIVE METABOLIC PANEL WITH GFR
ALT: 22 IU/L (ref 0–44)
AST: 21 IU/L (ref 0–40)
Albumin: 4.3 g/dL (ref 4.1–5.1)
Alkaline Phosphatase: 93 IU/L (ref 47–123)
BUN/Creatinine Ratio: 10 (ref 9–20)
BUN: 14 mg/dL (ref 6–24)
Bilirubin Total: 0.3 mg/dL (ref 0.0–1.2)
CO2: 23 mmol/L (ref 20–29)
Calcium: 9.5 mg/dL (ref 8.7–10.2)
Chloride: 103 mmol/L (ref 96–106)
Creatinine, Ser: 1.34 mg/dL — ABNORMAL HIGH (ref 0.76–1.27)
Globulin, Total: 2.7 g/dL (ref 1.5–4.5)
Glucose: 124 mg/dL — ABNORMAL HIGH (ref 70–99)
Potassium: 4.3 mmol/L (ref 3.5–5.2)
Sodium: 139 mmol/L (ref 134–144)
Total Protein: 7 g/dL (ref 6.0–8.5)
eGFR: 69 mL/min/1.73 (ref >59)

## 2023-12-05 LAB — CBC WITH DIFFERENTIAL/PLATELET
Basophils Absolute: 0 x10E3/uL (ref 0.0–0.2)
Basos: 1 %
EOS (ABSOLUTE): 0.2 x10E3/uL (ref 0.0–0.4)
Eos: 5 %
Hematocrit: 39.3 % (ref 37.5–51.0)
Hemoglobin: 12.5 g/dL — ABNORMAL LOW (ref 13.0–17.7)
Immature Grans (Abs): 0 x10E3/uL (ref 0.0–0.1)
Immature Granulocytes: 0 %
Lymphocytes Absolute: 1.9 x10E3/uL (ref 0.7–3.1)
Lymphs: 46 %
MCH: 26.3 pg — ABNORMAL LOW (ref 26.6–33.0)
MCHC: 31.8 g/dL (ref 31.5–35.7)
MCV: 83 fL (ref 79–97)
Monocytes Absolute: 0.4 x10E3/uL (ref 0.1–0.9)
Monocytes: 10 %
Neutrophils Absolute: 1.5 x10E3/uL (ref 1.4–7.0)
Neutrophils: 38 %
Platelets: 224 x10E3/uL (ref 150–450)
RBC: 4.75 x10E6/uL (ref 4.14–5.80)
RDW: 15 % (ref 11.6–15.4)
WBC: 4 x10E3/uL (ref 3.4–10.8)

## 2023-12-05 LAB — LIPID PANEL
Chol/HDL Ratio: 3.2 ratio (ref 0.0–5.0)
Cholesterol, Total: 212 mg/dL — ABNORMAL HIGH (ref 100–199)
HDL: 67 mg/dL (ref >39)
LDL Chol Calc (NIH): 135 mg/dL — ABNORMAL HIGH (ref 0–99)
Triglycerides: 54 mg/dL (ref 0–149)
VLDL Cholesterol Cal: 10 mg/dL (ref 5–40)

## 2023-12-05 LAB — HIV ANTIBODY (ROUTINE TESTING W REFLEX): HIV Screen 4th Generation wRfx: NONREACTIVE

## 2023-12-05 MED ORDER — ATORVASTATIN CALCIUM 40 MG PO TABS: 40.0000 mg | ORAL_TABLET | Freq: Every day | ORAL | 3 refills | Status: AC

## 2023-12-05 NOTE — Telephone Encounter (Signed)
 Pharmacy Patient Advocate Encounter  Received notification from [employer] that Prior Authorization for MOUNJARO  has been APPROVED from 12/04/23 to 12/03/24

## 2023-12-19 ENCOUNTER — Ambulatory Visit (HOSPITAL_BASED_OUTPATIENT_CLINIC_OR_DEPARTMENT_OTHER): Admitting: Family Medicine
